# Patient Record
Sex: Male | Born: 1990 | Race: Black or African American | Hispanic: No | Marital: Single | Smoking: Never smoker
Health system: Southern US, Community
[De-identification: ages and names within clinical notes are randomized; demographics above are authoritative.]

## PROBLEM LIST (undated history)

## (undated) DIAGNOSIS — E739 Lactose intolerance, unspecified: Secondary | ICD-10-CM

## (undated) DIAGNOSIS — R569 Unspecified convulsions: Secondary | ICD-10-CM

## (undated) HISTORY — PX: TONSILLECTOMY: SUR1361

---

## 2013-08-09 ENCOUNTER — Emergency Department (HOSPITAL_COMMUNITY): Payer: BC Managed Care – PPO

## 2013-08-09 ENCOUNTER — Emergency Department (HOSPITAL_COMMUNITY)
Admission: EM | Admit: 2013-08-09 | Discharge: 2013-08-09 | Disposition: A | Discharge status: Home / Self Care | Payer: BC Managed Care – PPO | Attending: Emergency Medicine | Admitting: Emergency Medicine

## 2013-08-09 ENCOUNTER — Encounter (HOSPITAL_COMMUNITY): Payer: Self-pay | Admitting: Emergency Medicine

## 2013-08-09 DIAGNOSIS — F1210 Cannabis abuse, uncomplicated: Secondary | ICD-10-CM

## 2013-08-09 DIAGNOSIS — Z862 Personal history of diseases of the blood and blood-forming organs and certain disorders involving the immune mechanism: Secondary | ICD-10-CM | POA: Insufficient documentation

## 2013-08-09 DIAGNOSIS — R569 Unspecified convulsions: Secondary | ICD-10-CM

## 2013-08-09 DIAGNOSIS — Z8639 Personal history of other endocrine, nutritional and metabolic disease: Secondary | ICD-10-CM | POA: Insufficient documentation

## 2013-08-09 DIAGNOSIS — F29 Unspecified psychosis not due to a substance or known physiological condition: Secondary | ICD-10-CM | POA: Insufficient documentation

## 2013-08-09 DIAGNOSIS — N289 Disorder of kidney and ureter, unspecified: Secondary | ICD-10-CM

## 2013-08-09 HISTORY — DX: Lactose intolerance, unspecified: E73.9

## 2013-08-09 LAB — BASIC METABOLIC PANEL
BUN: 12 mg/dL (ref 6–23)
CO2: 22 mEq/L (ref 19–32)
Calcium: 9.4 mg/dL (ref 8.4–10.5)
Chloride: 104 mEq/L (ref 96–112)
GFR calc non Af Amer: 70 mL/min — ABNORMAL LOW (ref >90)
Glucose, Bld: 119 mg/dL — ABNORMAL HIGH (ref 70–99)
Potassium: 3.6 mEq/L (ref 3.5–5.1)
Sodium: 140 mEq/L (ref 135–145)

## 2013-08-09 LAB — CBC
HCT: 38.5 % — ABNORMAL LOW (ref 39.0–52.0)
Hemoglobin: 13.3 g/dL (ref 13.0–17.0)
MCH: 30.7 pg (ref 26.0–34.0)
MCHC: 34.5 g/dL (ref 30.0–36.0)
MCV: 88.9 fL (ref 78.0–100.0)
Platelets: 214 K/uL (ref 150–400)
RBC: 4.33 MIL/uL (ref 4.22–5.81)
RDW: 12.9 % (ref 11.5–15.5)
WBC: 8.1 K/uL (ref 4.0–10.5)

## 2013-08-09 LAB — BASIC METABOLIC PANEL WITH GFR
Creatinine, Ser: 1.42 mg/dL — ABNORMAL HIGH (ref 0.50–1.35)
GFR calc Af Amer: 81 mL/min — ABNORMAL LOW (ref >90)

## 2013-08-09 LAB — GLUCOSE, CAPILLARY: Glucose-Capillary: 114 mg/dL — ABNORMAL HIGH (ref 70–99)

## 2013-08-09 NOTE — ED Notes (Signed)
CBG 114 

## 2013-08-09 NOTE — ED Notes (Signed)
Dr Oletta Lamas speaking with family

## 2013-08-09 NOTE — ED Provider Notes (Signed)
CSN: [id number]     Arrival date & time 08/09/13  0903 History   First MD Initiated Contact with Patient 08/09/13 1024     Chief Complaint  Patient presents with  . Seizures   (Consider location/radiation/quality/duration/timing/severity/associated sxs/prior Treatment) Patient is a 22 y.o. male presenting with seizures. The history is provided by the patient and a relative.  Seizures Seizure activity on arrival: no   Seizure type:  Unable to specify Preceding symptoms: aura   Episode characteristics: abnormal movements, disorientation and tongue biting   Episode characteristics: no incontinence   Postictal symptoms: confusion and memory loss   Return to baseline: yes   Severity:  Mild Timing:  Once Progression:  Resolved Context: decreased sleep, drug use and stress   Context: not alcohol withdrawal, not family hx of seizures, not fever, not intracranial lesion, medical compliance, not possible medication ingestion and not previous head injury   Recent head injury:  No recent head injuries PTA treatment:  None History of seizures: no     Past Medical History  Diagnosis Date  . Lactose intolerance    Past Surgical History  Procedure Laterality Date  . Tonsillectomy     History reviewed. No pertinent family history. History  Substance Use Topics  . Smoking status: Never Smoker   . Smokeless tobacco: Not on file  . Alcohol Use: No    Review of Systems  Constitutional: Negative for fever.  Respiratory: Negative for chest tightness and shortness of breath.   Cardiovascular: Negative for chest pain.  Neurological: Positive for seizures. Negative for headaches.  Psychiatric/Behavioral: Positive for confusion.  All other systems reviewed and are negative.    Allergies  Review of patient's allergies indicates no known allergies.  Home Medications  No current outpatient prescriptions on file. BP 107/60  Pulse 59  Temp(Src) 98.2 F (36.8 C) (Oral)  Resp 18  SpO2  100% Physical Exam  Nursing note and vitals reviewed. Constitutional: He is oriented to person, place, and time. He appears well-developed and well-nourished. No distress.  HENT:  Head: Normocephalic and atraumatic.  Eyes: Conjunctivae and EOM are normal.  Neck: Normal range of motion. Neck supple.  Cardiovascular: Normal rate and regular rhythm.   No murmur heard. Pulmonary/Chest: Effort normal. No respiratory distress.  Neurological: He is alert and oriented to person, place, and time. No cranial nerve deficit. He exhibits normal muscle tone. Coordination normal.  Skin: Skin is dry. He is not diaphoretic.  Psychiatric: He has a normal mood and affect.    ED Course  Procedures (including critical care time) Labs Review Labs Reviewed  BASIC METABOLIC PANEL - Abnormal; Notable for the following:    Glucose, Bld 119 (*)    Creatinine, Ser 1.42 (*)    GFR calc non Af Amer 70 (*)    GFR calc Af Amer 81 (*)    All other components within normal limits  CBC - Abnormal; Notable for the following:    HCT 38.5 (*)    All other components within normal limits  GLUCOSE, CAPILLARY - Abnormal; Notable for the following:    Glucose-Capillary 114 (*)    All other components within normal limits   Imaging Review Ct Head Wo Contrast  08/09/2013   CLINICAL DATA:  Seizure with fall  EXAM: CT HEAD WITHOUT CONTRAST  TECHNIQUE: Contiguous axial images were obtained from the base of the skull through the vertex without intravenous contrast. Study was obtained within 24 hr of patient's arrival at the emergency department.  COMPARISON:  None.  FINDINGS: The ventricles are normal in size and configuration. There is no mass, hemorrhage, extra-axial fluid collection, or midline shift. Gray-white compartments are normal. No demonstrable acute infarct.  The bony calvarium appears intact. The mastoid air cells are clear. There is either a polyp or retention cyst in the right frontal sinus.  IMPRESSION: Polyp  versus retention cyst in right frontal sinus. Study otherwise unremarkable.   Electronically Signed   By: Bretta Bang M.D.   On: 08/09/2013 11:57    EKG Interpretation    Date/Time:  Monday August 09 2013 16:10:96 EST Ventricular Rate:  90 PR Interval:  147 QRS Duration: 106 QT Interval:  372 QTC Calculation: 455 R Axis:   50 Text Interpretation:  Sinus rhythm RSR prime in V2 Otherwise within normal limits No previous tracing           ra sat is 100% and I interpret to be normal   MDM   1. Seizure   2. Renal insufficiency   3. Marijuana abuse       Pt with some features of possible initial seizure.  Pt admits to marijuana use this weekend, more than usual, didn't sleep much today.  Was in class when this occurred.  Has been at baseline, observed in the ED for 4 hours, no recurrences, head CT only shows cyst in sinus.  Labs otherwise ok except minimal renal insuff, this is discussed with pt and family, discussed legal issues regarding driving and need to follow up with PCP and outpt neurology    Gavin Pound. Anushka Hartinger, MD 08/09/13 [phone]

## 2013-08-09 NOTE — ED Notes (Signed)
Seizure pads placed on side rails

## 2013-08-09 NOTE — ED Notes (Addendum)
Pt brought via ems for new onset seizure.  Per ems bystander noticed pt shaking uncontrollably x 1 minute while sitting at desk.  Pt states he felt "wierd" when he woke up and fell twice while walking to class. Pt states he did smoke weed last night.  Worked last night at [employer].  CBG 134 per ems.  Trauma to tongue.  Given  4 mg zofran by ems for emesis x 1.  Denies headache.  Just c/o pain to L knee from fall.

## 2013-08-09 NOTE — Discharge Instructions (Signed)
Marijuana Abuse Your exam shows you have used marijuana or pot. There are many health problems related to marijuana abuse. These include:  Bronchitis.  Chronic cough.  Emphysema.  Lung and upper airway cancer. Abusers also experience impairment in:  Memory.  Judgment.  Ability to learn.  Coordination. Students who smoke marijuana:  Get lower grades.  Are less likely to graduate than those who do not. Adults who abuse marijuana:  Have problems at work.  May even lose their jobs due to:  Poor work International aid/development worker.  Absenteeism. Attention, memory, and learning skills have been shown to be diminished for up to 6 months after stopping regular use, and there is evidence that the effects can be cumulative over a lifetime.  Heavier use of marijuana also puts a strain on relationships with friends and loved ones and can lead to moodiness and loss of confidence. Acute intoxication can lead to:  Increased anxiety.  A panic episode. It also increases the risk for having an automobile accident. This is especially true if the pot is combined with alcohol or other intoxicants. Treatment for acute intoxication is rarely needed. However, medicine to reduce anxiety may be helpful in some people. Millions of people are considered to be dependent on marijuana. It is long-term regular use that leads to addiction and all of its complex problems. Information on the problem of addiction and the health problems of long-term abuse is posted at the [facility] for Drug Abuse website, http://www.price-smith.com/. Consult with your doctor or counselor if you want further information and support in handling this common problem. Document Released: 10/17/2004 Document Revised: 12/02/2011 Document Reviewed: 08/04/2007 [facility] Patient Information 2014 [place], [place].     Seizure, Adult A seizure is abnormal electrical activity in the brain. Seizures can cause a change in attention or behavior (altered  mental status). Seizures often involve uncontrollable shaking (convulsions). Seizures usually last from 30 seconds to 2 minutes. Epilepsy is a brain disorder in which a patient has repeated seizures over time. CAUSES  There are many different problems that can cause seizures. In some cases, no cause is found. Common causes of seizures include:  Head injuries.  Brain tumors.  Infections.  Imbalance of chemicals in the blood.  Kidney failure or liver failure.  Heart disease.  Drug abuse.  Stroke.  Withdrawal from certain drugs or alcohol.  Birth defects.  Malfunction of a neurosurgical device placed in the brain. SYMPTOMS  Symptoms vary depending on the part of the brain that is involved. Right before a seizure, you may have a warning (aura) that a seizure is about to occur. An aura may include the following symptoms:   Fear or anxiety.  Nausea.  Feeling like the room is spinning (vertigo).  Vision changes, such as seeing flashing lights or spots. Common symptoms during a seizure include:  Convulsions.  Drooling.  Rapid eye movements.  Grunting.  Loss of bladder and bowel control.  Bitter taste in the mouth. After a seizure, you may feel confused and sleepy. You may also have an injury resulting from convulsions during the seizure. DIAGNOSIS  Your caregiver will perform a physical exam and run some tests to determine the type and cause of your seizure. These tests may include:  Blood tests.  A lumbar puncture test. In this test, a small amount of fluid is removed from the spine and examined.  Electrocardiography (ECG). This test records the electrical activity in your heart.  Imaging tests, such as computed tomography (CT) scans or magnetic  resonance imaging (MRI).  Electroencephalography (EEG). This test records the electrical activity in your brain. TREATMENT  Seizures usually stop on their own. Treatment will depend on the cause of your seizure. In some  cases, medicine may be given to prevent future seizures. HOME CARE INSTRUCTIONS   If you are given medicines, take them exactly as prescribed by your caregiver.  Keep all follow-up appointments as directed by your caregiver.  Do not swim or drive until your caregiver says it is okay.  Teach friends and family what to do if you have a seizure. They should:  Lay you on the ground to prevent a fall.  Put a cushion under your head.  Loosen any tight clothing around your neck.  Turn you on your side. If vomiting occurs, this helps keep your airway clear.  Stay with you until you recover. SEEK IMMEDIATE MEDICAL CARE IF:  The seizure lasts longer than 2 to 5 minutes.  The seizure is severe or the person does not wake up after the seizure.  The person has altered mental status. Drive the person to the emergency department or call your local emergency services (911 in [place] MAKE SURE YOU:  Understand these instructions.  Will watch your condition.  Will get help right away if you are not doing well or get worse. Document Released: 09/06/2000 Document Revised: 12/02/2011 Document Reviewed: 08/28/2011 [facility] Patient Information 2014 [place], [place].

## 2013-08-17 ENCOUNTER — Encounter: Payer: Self-pay | Admitting: Neurology

## 2013-08-17 ENCOUNTER — Ambulatory Visit (INDEPENDENT_AMBULATORY_CARE_PROVIDER_SITE_OTHER): Payer: BC Managed Care – PPO | Admitting: Neurology

## 2013-08-17 VITALS — BP 116/68 | HR 68 | Ht 71.5 in | Wt 222.0 lb

## 2013-08-17 DIAGNOSIS — G40909 Epilepsy, unspecified, not intractable, without status epilepticus: Secondary | ICD-10-CM | POA: Insufficient documentation

## 2013-08-17 DIAGNOSIS — G40209 Localization-related (focal) (partial) symptomatic epilepsy and epileptic syndromes with complex partial seizures, not intractable, without status epilepticus: Secondary | ICD-10-CM

## 2013-08-17 NOTE — Progress Notes (Signed)
[facility]    Provider:  Dr Hosie Poisson Referring Provider: Lear Ng., MD Primary Care Physician:  No PCP Per Patient  CC:  New onset seizure  HPI:  Jeremy Harrington is a 22 y.o. male here as a referral from Dr. Oletta Lamas for new onset seizure. Was really tired that morning, felt like he was not his normal self, felt full body twitches, fell over a few times. Then had LOC, next thing he recalls was being in an ambulance. Friends in class note that he was talking during the episode, he is unaware of the whole event. Did bite his tongue, no loss bowel/bladder. Woke up at 7am that morning, gone to bed around 2:30am the night before. No different events that night. No breakfast that morning. He denies any EtOH, tobacco, drug usage. No recent fevers, illnesses, neck pain. No history of head trauma. No seizures in the past. No palpitations during the event, no loss of vision/blurry vision prior to the event.   Per ER notes patient admits to marijuana use the night before.   Otherwise healthy.   Head CT reviewed and imaging was unremarkable  Review of Systems: Out of a complete 14 system review, the patient complains of only the following symptoms, and all other reviewed systems are negative. Denies any positive review of systems  History   Social History  . Marital Status: Single    Spouse Name: N/A    Number of Children: 0  . Years of Education: 12+   Occupational History  . [employer]AGE [employer]   Social History Main Topics  . Smoking status: Never Smoker   . Smokeless tobacco: Never Used  . Alcohol Use: Yes     Comment: rare  . Drug Use: No     Comment: patient quit illiect drugs on 08/09/13  . Sexual Activity: Not on file   Other Topics Concern  . Not on file   Social History Narrative   Patient lives with roommates.    Patient works at [employer].    Patient has some college.    Patient has no children.     Family History  Problem Relation Age of Onset   . High blood pressure Mother   . High blood pressure Maternal Uncle     Past Medical History  Diagnosis Date  . Lactose intolerance     Past Surgical History  Procedure Laterality Date  . Tonsillectomy      No current outpatient prescriptions on file.   No current facility-administered medications for this visit.    Allergies as of 08/17/2013  . (No Known Allergies)    Vitals: BP 116/68  Pulse 68  Ht 5' 11.5" (1.816 m)  Wt 222 lb (100.699 kg)  BMI 30.53 kg/m2 Last Weight:  Wt Readings from Last 1 Encounters:  08/17/13 222 lb (100.699 kg)   Last Height:   Ht Readings from Last 1 Encounters:  08/17/13 5' 11.5" (1.816 m)     Physical exam: Exam: Gen: NAD, conversant Eyes: anicteric sclerae, moist conjunctivae HENT: Atraumatic, oropharynx clear Neck: Trachea midline; supple,  Lungs: CTA, no wheezing, rales, rhonic                          CV: RRR, no MRG Abdomen: Soft, non-tender;  Extremities: No peripheral edema  Skin: Normal temperature, no rash,  Psych: Appropriate affect, pleasant  Neuro: MS: AA&Ox3, appropriately interactive, normal affect   Speech: fluent w/o paraphasic error  Memory: good recent and remote recall  CN: PERRL, EOMI no nystagmus, no ptosis, sensation intact to LT V1-V3 bilat, face symmetric, no weakness, hearing grossly intact, palate elevates symmetrically, shoulder shrug 5/5 bilat,  tongue protrudes midline, no fasiculations noted.  Motor: normal bulk and tone Strength: 5/5  In all extremities  Coord: rapid alternating and point-to-point (FNF, HTS) movements intact.  Reflexes: symmetrical, bilat downgoing toes  Sens: LT intact in all extremities  Gait: posture, stance, stride and arm-swing normal. Tandem gait intact. Able to walk on heels and toes. Romberg absent.   Assessment:  After physical and neurologic examination, review of laboratory studies, imaging, neurophysiology testing and pre-existing records, assessment  will be reviewed on the problem list.  Plan:  Treatment plan and additional workup will be reviewed under Problem List.  1)Complex partial seizure  22 y/o gentleman presenting for initial evaluation of likely seizure episode. The event appears to be complex partial in nature as patient was talking during the whole event. Suspect is likely provoked from lack of sleep stress and/or drug use. Patient was counseled to improve sleep hygiene, eat breakfast, void excessive alcohol drug use. Patient was counseled to avoid driving for 6 months. We'll hold off on EEG and or MRI at this time as exam is unremarkable. No signs of infectious process, therefore no LP needed at this time. No indication for antiepileptic medication as this is his initial event. Followup as needed.

## 2014-10-24 ENCOUNTER — Emergency Department (HOSPITAL_COMMUNITY)
Admission: EM | Admit: 2014-10-24 | Discharge: 2014-10-24 | Disposition: A | Discharge status: Home / Self Care | Payer: 59 | Attending: Emergency Medicine | Admitting: Emergency Medicine

## 2014-10-24 ENCOUNTER — Encounter (HOSPITAL_COMMUNITY): Payer: Self-pay | Admitting: Emergency Medicine

## 2014-10-24 DIAGNOSIS — Y998 Other external cause status: Secondary | ICD-10-CM | POA: Insufficient documentation

## 2014-10-24 DIAGNOSIS — R569 Unspecified convulsions: Secondary | ICD-10-CM | POA: Diagnosis present

## 2014-10-24 DIAGNOSIS — S199XXA Unspecified injury of neck, initial encounter: Secondary | ICD-10-CM | POA: Diagnosis not present

## 2014-10-24 DIAGNOSIS — S0081XA Abrasion of other part of head, initial encounter: Secondary | ICD-10-CM | POA: Diagnosis not present

## 2014-10-24 DIAGNOSIS — Y9389 Activity, other specified: Secondary | ICD-10-CM | POA: Insufficient documentation

## 2014-10-24 DIAGNOSIS — Y9289 Other specified places as the place of occurrence of the external cause: Secondary | ICD-10-CM | POA: Insufficient documentation

## 2014-10-24 DIAGNOSIS — G40909 Epilepsy, unspecified, not intractable, without status epilepticus: Secondary | ICD-10-CM | POA: Insufficient documentation

## 2014-10-24 DIAGNOSIS — Z8639 Personal history of other endocrine, nutritional and metabolic disease: Secondary | ICD-10-CM | POA: Insufficient documentation

## 2014-10-24 DIAGNOSIS — X58XXXA Exposure to other specified factors, initial encounter: Secondary | ICD-10-CM | POA: Diagnosis not present

## 2014-10-24 HISTORY — DX: Unspecified convulsions: R56.9

## 2014-10-24 LAB — BASIC METABOLIC PANEL
Anion gap: 14 (ref 5–15)
BUN: 15 mg/dL (ref 6–23)
CHLORIDE: 108 mmol/L (ref 96–112)
CO2: 17 mmol/L — AB (ref 19–32)
CREATININE: 1.69 mg/dL — AB (ref 0.50–1.35)
Calcium: 9.4 mg/dL (ref 8.4–10.5)
GFR calc non Af Amer: 56 mL/min — ABNORMAL LOW (ref >90)
GFR, EST AFRICAN AMERICAN: 65 mL/min — AB (ref >90)
Glucose, Bld: 108 mg/dL — ABNORMAL HIGH (ref 70–99)
POTASSIUM: 4 mmol/L (ref 3.5–5.1)
Sodium: 139 mmol/L (ref 135–145)

## 2014-10-24 LAB — CBC WITH DIFFERENTIAL/PLATELET
BASOS ABS: 0 10*3/uL (ref 0.0–0.1)
Basophils Relative: 0 % (ref 0–1)
Eosinophils Absolute: 0.1 10*3/uL (ref 0.0–0.7)
Eosinophils Relative: 1 % (ref 0–5)
HCT: 41.8 % (ref 39.0–52.0)
Hemoglobin: 14.4 g/dL (ref 13.0–17.0)
LYMPHS PCT: 15 % (ref 12–46)
Lymphs Abs: 1.1 10*3/uL (ref 0.7–4.0)
MCH: 30.6 pg (ref 26.0–34.0)
MCHC: 34.4 g/dL (ref 30.0–36.0)
MCV: 88.7 fL (ref 78.0–100.0)
MONOS PCT: 8 % (ref 3–12)
Monocytes Absolute: 0.6 10*3/uL (ref 0.1–1.0)
NEUTROS ABS: 5.9 10*3/uL (ref 1.7–7.7)
NEUTROS PCT: 76 % (ref 43–77)
Platelets: 207 10*3/uL (ref 150–400)
RBC: 4.71 MIL/uL (ref 4.22–5.81)
RDW: 12.4 % (ref 11.5–15.5)
WBC: 7.6 10*3/uL (ref 4.0–10.5)

## 2014-10-24 NOTE — ED Notes (Signed)
Pt reports to ED via EMS. Girlfriend got pt out of bed this morning and noticed some muscle twitching, as pt got to a standing position he began having full body jerking, hit head on sheetrock and fell to floor. Lac present to posterior right side of head and lac present on left side of tongue. No incontinence reported. Pt complaining of right sided neck pain. Pt had 1 seizure back in Dec 2014. Was told they were related to stress and dehydration. Pt alert and oriented at this time.

## 2014-10-24 NOTE — ED Provider Notes (Signed)
CSN: [id number]     Arrival date & time 10/24/14  1053 History   First MD Initiated Contact with Patient 10/24/14 1110     Chief Complaint  Patient presents with  . Seizures   Jeremy Harrington is a 24 y.o. male with a history of one previous seizure in 2014 who presents to the ED after having a full body seizure just prior to arrival. His girlfriend reports that they noticed twitching prior to his seizure. He then had a full body seizure and fell to the floor. He has a small abrasion to his posterior head and an abrasion to his tongue. The seizure lasted a few seconds. No incontinence of bowel or bladder. Patient reports his last seizure was in December 2014. Patient reports he saw a neurologist at [facility], but has never been on any seizure medication. His mother reports there is a history of epilepsy in his family. At the time of my evaluation the patient reports feeling fine. He denies fevers, chills, recent illness, drug use, headache, changes to his vision, numbness, tingling, chest pain, abdominal pain, nausea, vomiting, shortness of breath or weakness.  (Consider location/radiation/quality/duration/timing/severity/associated sxs/prior Treatment) HPI  Past Medical History  Diagnosis Date  . Lactose intolerance   . Seizures    Past Surgical History  Procedure Laterality Date  . Tonsillectomy     Family History  Problem Relation Age of Onset  . High blood pressure Mother   . High blood pressure Maternal Uncle    History  Substance Use Topics  . Smoking status: Never Smoker   . Smokeless tobacco: Never Used  . Alcohol Use: Yes     Comment: rare    Review of Systems  Constitutional: Negative for fever and chills.  HENT: Negative for congestion, ear pain, sore throat and trouble swallowing.   Eyes: Negative for pain and visual disturbance.  Respiratory: Negative for cough, shortness of breath and wheezing.   Cardiovascular: Negative for chest pain and  palpitations.  Gastrointestinal: Negative for nausea, vomiting, abdominal pain and diarrhea.  Genitourinary: Negative for dysuria and hematuria.  Musculoskeletal: Negative for back pain, neck pain and neck stiffness.  Skin: Negative for rash.  Neurological: Positive for seizures. Negative for dizziness, speech difficulty, weakness, light-headedness, numbness and headaches.      Allergies  Review of patient's allergies indicates no known allergies.  Home Medications   Prior to Admission medications   Medication Sig Start Date End Date Taking? Authorizing Provider  ibuprofen (ADVIL,MOTRIN) 400 MG tablet Take 400 mg by mouth every 6 (six) hours as needed for mild pain.   Yes Historical Provider, MD   BP 118/75 mmHg  Pulse 63  Temp(Src) 97.4 F (36.3 C) (Oral)  Resp 13  SpO2 100% Physical Exam  Constitutional: He is oriented to person, place, and time. He appears well-developed and well-nourished. No distress.  HENT:  Head: Normocephalic.  Right Ear: External ear normal.  Left Ear: External ear normal.  Nose: Nose normal.  Mouth/Throat: Oropharynx is clear and moist. No oropharyngeal exudate.  Very small abrasion, 2 mm, to posterior head. Bleeding is controlled.  No crepitus, deformity or edema noted. Small laceration to his posterior tongue. Laceration is not bleeding. Oropharynx is clear. Bilateral tympanic membranes are pearly-gray without erythema or loss of landmarks.  Eyes: Conjunctivae and EOM are normal. Pupils are equal, round, and reactive to light. Right eye exhibits no discharge. Left eye exhibits no discharge.  Neck: Normal range of motion. Neck supple.  Right  lateral neck is mildly tender to palpation. No midline tenderness. No crepitus, step-off or deformities noted.  Cardiovascular: Normal rate, regular rhythm, normal heart sounds and intact distal pulses.  Exam reveals no gallop and no friction rub.   No murmur heard. Pulmonary/Chest: Effort normal and breath  sounds normal. No respiratory distress. He has no wheezes. He has no rales.  Abdominal: Soft. Bowel sounds are normal. He exhibits no distension. There is no tenderness.  Musculoskeletal: He exhibits no edema.  Lymphadenopathy:    He has no cervical adenopathy.  Neurological: He is alert and oriented to person, place, and time. No cranial nerve deficit. Coordination normal.  Patient alert and oriented 3. Cranial nerves intact.  Skin: Skin is warm and dry. No rash noted. He is not diaphoretic. No erythema. No pallor.  Psychiatric: He has a normal mood and affect. His behavior is normal.  Nursing note and vitals reviewed.   ED Course  Procedures (including critical care time) Labs Review Labs Reviewed  BASIC METABOLIC PANEL - Abnormal; Notable for the following:    CO2 17 (*)    Glucose, Bld 108 (*)    Creatinine, Ser 1.69 (*)    GFR calc non Af Amer 56 (*)    GFR calc Af Amer 65 (*)    All other components within normal limits  CBC WITH DIFFERENTIAL/PLATELET    Imaging Review No results found.   EKG Interpretation   Date/Time:  Monday October 24 2014 11:06:24 EST Ventricular Rate:  69 PR Interval:  163 QRS Duration: 104 QT Interval:  390 QTC Calculation: 418 R Axis:   48 Text Interpretation:  Sinus rhythm Consider left atrial enlargement  Anteroseptal infarct, age indeterminate No significant change since last  tracing Confirmed by [facility]  MD, WHITNEY ([id number]) on 10/24/2014 1:16:59 PM      Filed Vitals:   10/24/14 1245 10/24/14 1300 10/24/14 1315 10/24/14 1330  BP: 114/65 114/62 121/73 118/75  Pulse: 70 59 63 63  Temp:    97.4 F (36.3 C)  TempSrc:    Oral  Resp: 16 18 17 13   SpO2: 98% 98% 100% 100%     MDM   Meds given in ED:  Medications - No data to display  Discharge Medication List as of 10/24/2014  1:26 PM      Final diagnoses:  Seizure   This is a 24 year old male who presents to emergency room after a seizure prior to arrival. Patient reports  this is his second seizure however. His last seizure was 2 years ago in December 2010. Patient is seen neurologist at [facility] but has never been put on seizure medication previously. Patient denies recent illness, fevers, alcohol or drug use. Patient is afebrile and nontoxic-appearing. Patient is alert and oriented 3. Patient has a very small abrasion to his posterior head that is not eating. Patient has a small laceration to the posterior aspect of his tongue that is nonbleeding. Patient denies headache or changes to his vision. Patient denies loss of bowel or bladder control. Patient is neurologically intact. Patient's BMP indicates the elevated creatinine 1.69. He had a previous elevated creatinine his last seizure. I advised the patient of his elevated creatinine and that he needs this to be rechecked by his PCP. I advised the patient [facility] I advised the patient not to drive until he followed up with his neurologist. I advised the patient to follow-up with their primary care provider this week.  I advised the patient to return to the emergency department with new or worsening symptoms or new concerns. The patient verbalized understanding and agreement with plan.   This patient was discussed with Dr. Anitra Lauth who agrees with assessment and plan.      Lawana Chambers, PA-C 10/24/14 1541  Gwyneth Sprout, MD 10/24/14 [phone]

## 2014-10-24 NOTE — Discharge Instructions (Signed)
Seizure, Adult °A seizure is abnormal electrical activity in the brain. Seizures usually last from 30 seconds to 2 minutes. There are various types of seizures. °Before a seizure, you may have a warning sensation (aura) that a seizure is about to occur. An aura may include the following symptoms:  °· Fear or anxiety. °· Nausea. °· Feeling like the room is spinning (vertigo). °· Vision changes, such as seeing flashing lights or spots. °Common symptoms during a seizure include: °· A change in attention or behavior (altered mental status). °· Convulsions with rhythmic jerking movements. °· Drooling. °· Rapid eye movements. °· Grunting. °· Loss of bladder and bowel control. °· Bitter taste in the mouth. °· Tongue biting. °After a seizure, you may feel confused and sleepy. You may also have an injury resulting from convulsions during the seizure. °HOME CARE INSTRUCTIONS  °· If you are given medicines, take them exactly as prescribed by your health care provider. °· Keep all follow-up appointments as directed by your health care provider. °· Do not swim or drive or engage in risky activity during which a seizure could cause further injury to you or others until your health care provider says it is OK. °· Get adequate rest. °· Teach friends and family what to do if you have a seizure. They should: °· Lay you on the ground to prevent a fall. °· Put a cushion under your head. °· Loosen any tight clothing around your neck. °· Turn you on your side. If vomiting occurs, this helps keep your airway clear. °· Stay with you until you recover. °· Know whether or not you need emergency care. °SEEK IMMEDIATE MEDICAL CARE IF: °· The seizure lasts longer than 5 minutes. °· The seizure is severe or you do not wake up immediately after the seizure. °· You have an altered mental status after the seizure. °· You are having more frequent or worsening seizures. °Someone should drive you to the emergency department or call local emergency  services (911 in [place] °MAKE SURE YOU: °· Understand these instructions. °· Will watch your condition. °· Will get help right away if you are not doing well or get worse. °Document Released: 09/06/2000 Document Revised: 06/30/2013 Document Reviewed: 04/21/2013 °ExitCare® Patient Information ©2015 [employer]. This information is not intended to replace advice given to you by your health care provider. Make sure you discuss any questions you have with your health care provider. ° °Driving and Equipment Restrictions °Some medical problems make it dangerous to drive, ride a bike, or use machines. Some of these problems are: °· A hard blow to the head (concussion). °· Passing out (fainting). °· Twitching and shaking (seizures). °· Low blood sugar. °· Taking medicine to help you relax (sedatives). °· Taking pain medicines. °· Wearing an eye patch. °· Wearing splints. This can make it hard to use parts of your body that you need to drive safely. °HOME CARE  °· Do not drive until your doctor says it is okay. °· Do not use machines until your doctor says it is okay. °You may need a form signed by your doctor (medical release) before you can drive again. You may also need this form before you do other tasks where you need to be fully alert. °MAKE SURE YOU: °· Understand these instructions. °· Will watch your condition. °· Will get help right away if you are not doing well or get worse. °Document Released: 10/17/2004 Document Revised: 12/02/2011 Document Reviewed: 01/17/2010 °ExitCare® Patient Information ©2015 [employer].   This information is not intended to replace advice given to you by your health care provider. Make sure you discuss any questions you have with your health care provider. ° °

## 2015-01-25 ENCOUNTER — Telehealth: Payer: Self-pay | Admitting: Neurology

## 2015-01-25 ENCOUNTER — Ambulatory Visit (INDEPENDENT_AMBULATORY_CARE_PROVIDER_SITE_OTHER): Payer: 59 | Admitting: Neurology

## 2015-01-25 ENCOUNTER — Encounter: Payer: Self-pay | Admitting: Neurology

## 2015-01-25 VITALS — BP 117/66 | HR 68 | Ht 70.0 in | Wt 231.0 lb

## 2015-01-25 DIAGNOSIS — G40409 Other generalized epilepsy and epileptic syndromes, not intractable, without status epilepticus: Secondary | ICD-10-CM

## 2015-01-25 DIAGNOSIS — R569 Unspecified convulsions: Secondary | ICD-10-CM

## 2015-01-25 MED ORDER — DIVALPROEX SODIUM ER 500 MG PO TB24: 1000.0000 mg | ORAL_TABLET | Freq: Every day | ORAL | Status: DC

## 2015-01-25 NOTE — Procedures (Signed)
      History: Rosezetta SchlatterScott Newman-Brooks is a 24 year old patient with a history of generalized tonic-clonic seizures and a history of myoclonic jerks. 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  This is a routine EEG. No skull defects are noted. Medications include ibuprofen, and Depakote will be added.  EEG classification: Dysrhythmia grade 3 generalized  Description of the recording: The background rhythms of this recording consists of a well modulated medium amplitude alpha rhythm of 9 Hz that is reactive to eye opening and closure. As the record progresses, photic stimulation is performed, this results in a bilateral and symmetric photic driving response. Hyperventilation is then performed, this results in a minimal buildup of the background rhythm activities, mild symmetric slowing is seen. Throughout the recording, intermittent episodes of generalized sharp and slow-wave complexes are seen, occasionally associated with spike wave discharges that are generalized. There is some suppression of background rhythm activities just prior to and after the generalized discharges. No focal slowing seen. EKG monitor shows no evidence of cardiac rhythm abnormalities with a heart rate of 56.  Impression: This is an abnormal EEG recording secondary to generalized spike-wave and sharp and slow-wave complexes representing interictal activity, no electrographic seizures were recorded. This study suggests a generalized epilepsy syndrome.

## 2015-01-25 NOTE — Patient Instructions (Signed)
Overall you are doing fairly well but I do want to suggest a few things today:   Remember to drink plenty of fluid, eat healthy meals and do not skip any meals. Try to eat protein with a every meal and eat a healthy snack such as fruit or nuts in between meals. Try to keep a regular sleep-wake schedule and try to exercise daily, particularly in the form of walking, 20-30 minutes a day, if you can.   As far as your medications are concerned, I would like to suggest: Depakote extended release 1000mg  in the evening. Discussed common side effects, please see printed documents for details.   As far as diagnostic testing: EEG, MRI of the brain w/wo contrast  I would like to see you back in 6 weeks, sooner if we need to. Please call us with any interim questions, concerns, problems, updates or refill requests.   Please also call us for any test results so we can go over those with you on the phone.  My clinical assistant and will answer any of your questions and relay your messages to me and also relay most of my messages to you.   Our phone number is [phone]. We also have an after hours call service for urgent matters and there is a physician on-call for urgent questions. For any emergencies you know to call 911 or go to the nearest emergency room

## 2015-01-25 NOTE — Telephone Encounter (Signed)
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

## 2015-01-25 NOTE — Progress Notes (Signed)
[facility]    Provider:  Dr Lucia GaskinsAhern Referring Provider: No ref. provider found Primary Care Physician:  No PCP Per Patient  CC: body jerking  HPI:  Jeremy Harrington is a 24 y.o. male here as a follow up for episodes of body jerking and one GTCS seizure. He is a former patient of Dr. Hosie PoissonSumner and is transitioning care to me. Today patient woke up, started getting ready for work, made something to eat. He kept dropping food due to body jerking. Sometimes he will fall, sometimes his arms will go in the air. Happens usually in the mornings. Sometimes he will fall, has fallen three times including today.   He was going down the stairs this morning then fell down the stairs after a jerk. Bit tongue today and in the past He has jerks sporadically, worse in the morning. Can be more whole body or just a limb. He gets tired after he has them and sleeps a lot.  In February he had an episode that became more generalized. Started with jerks, then his whole body was stiff, he fell and his head went through the drywall with blood and foam coming out of his mouth. Girlfriend provides information  today. +Family history of epilepsy however not sure what kind. No staring spells.      Reviewed notes, labs and imaging from outside physicians, which showed:  07/2013: patient seen in ED felt weird then fell twice while walking to class. nex thing he remembers was being in an ambulance.  Trauma to tongue. Was not given AEDs as episode may have been caused by "weed" use and sleep deprivation. 10/24/14: Girlfriend noticed twitching in the morning, then full body jerking, hit head and fell to floor. Lac on left side of tongue.     Ct head showed No acute intracranial abnormalities including mass lesion or mass effect, hydrocephalus, extra-axial fluid collection, midline shift, hemorrhage, or acute infarction, large ischemic events (personally reviewed images)  Review of Systems: Patient complains  of symptoms per HPI as well as the following symptoms: fatigue, blurred vision, excess sweating, dizziness, seizure,weakness,tremors,passing out, back pain, anxious Pertinent negatives per HPI. All others negative.   History   Social History  . Marital Status: Single    Spouse Name: N/A  . Number of Children: 0  . Years of Education: 12+   Occupational History  . [employer]   Social History Main Topics  . Smoking status: Never Smoker   . Smokeless tobacco: Never Used  . Alcohol Use: 0.0 oz/week    0 Standard drinks or equivalent per week     Comment: rare  . Drug Use: No     Comment: patient quit illiect drugs on 08/09/13  . Sexual Activity: Not on file   Other Topics Concern  . Not on file   Social History Narrative   Patient lives with roommates.    Patient works at [employer].    Patient has some college.    Patient has no children.     Family History  Problem Relation Age of Onset  . High blood pressure Mother   . High blood pressure Maternal Uncle     Past Medical History  Diagnosis Date  . Lactose intolerance   . Seizures     Past Surgical History  Procedure Laterality Date  . Tonsillectomy      Current Outpatient Prescriptions  Medication Sig Dispense Refill  . ibuprofen (ADVIL,MOTRIN) 400 MG tablet Take 400 mg by mouth  every 6 (six) hours as needed for mild pain.    . divalproex (DEPAKOTE ER) 500 MG 24 hr tablet Take 2 tablets (1,000 mg total) by mouth daily. 60 tablet 6   No current facility-administered medications for this visit.    Allergies as of 01/25/2015  . (No Known Allergies)    Vitals: BP 117/66 mmHg  Pulse 68  Ht 5\' 10"  (1.778 m)  Wt 231 lb (104.781 kg)  BMI 33.15 kg/m2 Last Weight:  Wt Readings from Last 1 Encounters:  01/25/15 231 lb (104.781 kg)   Last Height:   Ht Readings from Last 1 Encounters:  01/25/15 5\' 10"  (1.778 m)   Physical exam: Exam: Gen: NAD, conversant, well nourised, obese, well groomed                      CV: RRR, no MRG. No Carotid Bruits. No peripheral edema, warm, nontender Eyes: Conjunctivae clear without exudates or hemorrhage  Neuro: Detailed Neurologic Exam  Speech:    Speech is normal; fluent and spontaneous with normal comprehension.  Cognition:    The patient is oriented to person, place, and time;     recent and remote memory intact;     language fluent;     normal attention, concentration,     fund of knowledge Cranial Nerves:    The pupils are equal, round, and reactive to light. The fundi are normal and spontaneous venous pulsations are present. Visual fields are full to finger confrontation. Extraocular movements are intact. Trigeminal sensation is intact and the muscles of mastication are normal. The face is symmetric. The palate elevates in the midline. Hearing intact. Voice is normal. Shoulder shrug is normal. The tongue has normal motion without fasciculations.    Motor Observation:    No asymmetry, no atrophy, and no involuntary movements noted. Tone:    Normal muscle tone.    Posture:    Posture is normal. normal erect    Strength:    Strength is V/V in the upper and lower limbs.         Assessment/Plan:  24 year old male with episodes of myoclonus and GTCS. 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  EEG Start Depakote. Discussed common side effects including tremor, weight gain, hair loss. Will check LFTs. Stop "weed". Advised will test him in 6 weeks. CBC, CMP MRI of the brain w/wo contrast No driving for 6 months seizure free, discussed at length.   Naomie DeanAntonia Nallely Yost, MD  [facility] [address] [place], [place] [id number]  Phone [phone] Fax [phone]  A total of 45 minutes was spent face-to-face with this patient. Over half this time was spent on counseling patient on the seizure diagnosis and different diagnostic and therapeutic options available.

## 2015-01-26 LAB — CBC
HEMATOCRIT: 44.1 % (ref 37.5–51.0)
Hemoglobin: 15.1 g/dL (ref 12.6–17.7)
MCH: 31 pg (ref 26.6–33.0)
MCHC: 34.2 g/dL (ref 31.5–35.7)
MCV: 91 fL (ref 79–97)
Platelets: 256 10*3/uL (ref 150–379)
RBC: 4.87 x10E6/uL (ref 4.14–5.80)
RDW: 13.2 % (ref 12.3–15.4)
WBC: 10.7 10*3/uL (ref 3.4–10.8)

## 2015-01-26 LAB — COMPREHENSIVE METABOLIC PANEL
ALT: 22 IU/L (ref 0–44)
AST: 29 IU/L (ref 0–40)
Albumin/Globulin Ratio: 1.6 (ref 1.1–2.5)
Albumin: 4.5 g/dL (ref 3.5–5.5)
Alkaline Phosphatase: 67 IU/L (ref 39–117)
BUN/Creatinine Ratio: 11 (ref 8–19)
BUN: 18 mg/dL (ref 6–20)
Bilirubin Total: 0.5 mg/dL (ref 0.0–1.2)
CHLORIDE: 102 mmol/L (ref 97–108)
CO2: 21 mmol/L (ref 18–29)
Calcium: 9.8 mg/dL (ref 8.7–10.2)
Creatinine, Ser: 1.6 mg/dL — ABNORMAL HIGH (ref 0.76–1.27)
GFR calc Af Amer: 70 mL/min/{1.73_m2} (ref >59)
GFR calc non Af Amer: 60 mL/min/{1.73_m2} (ref >59)
Globulin, Total: 2.8 g/dL (ref 1.5–4.5)
Glucose: 91 mg/dL (ref 65–99)
POTASSIUM: 4.3 mmol/L (ref 3.5–5.2)
Sodium: 140 mmol/L (ref 134–144)
TOTAL PROTEIN: 7.3 g/dL (ref 6.0–8.5)

## 2015-01-27 ENCOUNTER — Telehealth: Payer: Self-pay

## 2015-01-27 NOTE — Telephone Encounter (Signed)
Pt was informed of elevated Creatine and that he should follow up with his primary care physician.

## 2015-01-30 ENCOUNTER — Other Ambulatory Visit: Payer: Self-pay | Admitting: Neurology

## 2015-01-30 ENCOUNTER — Encounter: Payer: Self-pay | Admitting: Neurology

## 2015-01-30 ENCOUNTER — Telehealth: Payer: Self-pay | Admitting: Neurology

## 2015-01-30 MED ORDER — LEVETIRACETAM 500 MG PO TABS: 500.0000 mg | ORAL_TABLET | Freq: Two times a day (BID) | ORAL | Status: DC

## 2015-01-30 NOTE — Telephone Encounter (Signed)
Placed work letter from Dr. Lucia GaskinsAhern on 01/30/15 at the front desk ([facility]) for pt to pick up.

## 2015-01-30 NOTE — Telephone Encounter (Signed)
I spoke with patient who verified his Rx has been changed by Dr Lucia GaskinsAhern.  Nothing further is needed at this time.

## 2015-01-30 NOTE — Telephone Encounter (Signed)
Patient called and stated that he has some questions about his medication and would like to speak with someone. 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 Please call and advise.

## 2015-02-02 ENCOUNTER — Ambulatory Visit (INDEPENDENT_AMBULATORY_CARE_PROVIDER_SITE_OTHER): Payer: 59

## 2015-02-02 DIAGNOSIS — G40409 Other generalized epilepsy and epileptic syndromes, not intractable, without status epilepticus: Secondary | ICD-10-CM | POA: Diagnosis not present

## 2015-02-03 MED ORDER — GADOPENTETATE DIMEGLUMINE 469.01 MG/ML IV SOLN
20.0000 mL | Freq: Once | INTRAVENOUS | Status: AC | PRN
Start: 2015-02-03 — End: 2015-02-03

## 2015-02-06 ENCOUNTER — Telehealth: Payer: Self-pay

## 2015-02-06 NOTE — Telephone Encounter (Signed)
Spoke with pt about Normal MRI results.  Thanks

## 2015-02-06 NOTE — Telephone Encounter (Signed)
Patient called/returning Joy's call regarding his results. Please call and advise. Patient can be reached @ [phone]

## 2015-02-06 NOTE — Telephone Encounter (Signed)
VM left to inform  Pt of normal MRI results.  He was instructed to call office back

## 2015-03-17 ENCOUNTER — Encounter: Payer: Self-pay | Admitting: Neurology

## 2015-03-17 ENCOUNTER — Ambulatory Visit (INDEPENDENT_AMBULATORY_CARE_PROVIDER_SITE_OTHER): Payer: 59 | Admitting: Neurology

## 2015-03-17 VITALS — BP 134/82 | HR 82 | Ht 70.0 in | Wt 233.0 lb

## 2015-03-17 DIAGNOSIS — N189 Chronic kidney disease, unspecified: Secondary | ICD-10-CM

## 2015-03-17 DIAGNOSIS — G40B09 Juvenile myoclonic epilepsy, not intractable, without status epilepticus: Secondary | ICD-10-CM | POA: Diagnosis not present

## 2015-03-17 MED ORDER — LEVETIRACETAM 500 MG PO TABS: 500.0000 mg | ORAL_TABLET | Freq: Two times a day (BID) | ORAL | Status: AC

## 2015-03-17 NOTE — Patient Instructions (Signed)
Overall you are doing fairly well but I do want to suggest a few things today:   Remember to drink plenty of fluid, eat healthy meals and do not skip any meals. Try to eat protein with a every meal and eat a healthy snack such as fruit or nuts in between meals. Try to keep a regular sleep-wake schedule and try to exercise daily, particularly in the form of walking, 20-30 minutes a day, if you can.   As far as your medications are concerned, I would like to suggest: continue current medications  I would like to see you back in 3 months, sooner if we need to. Please call us with any interim questions, concerns, problems, updates or refill requests.   Please also call us for any test results so we can go over those with you on the phone.  My clinical assistant and will answer any of your questions and relay your messages to me and also relay most of my messages to you.   Our phone number is [phone]. We also have an after hours call service for urgent matters and there is a physician on-call for urgent questions. For any emergencies you know to call 911 or go to the nearest emergency room   

## 2015-03-18 DIAGNOSIS — G40B09 Juvenile myoclonic epilepsy, not intractable, without status epilepticus: Secondary | ICD-10-CM | POA: Insufficient documentation

## 2015-03-18 DIAGNOSIS — N189 Chronic kidney disease, unspecified: Secondary | ICD-10-CM | POA: Insufficient documentation

## 2015-03-18 LAB — COMPREHENSIVE METABOLIC PANEL
A/G RATIO: 1.3 (ref 1.1–2.5)
ALBUMIN: 4.2 g/dL (ref 3.5–5.5)
ALT: 14 IU/L (ref 0–44)
AST: 16 IU/L (ref 0–40)
Alkaline Phosphatase: 67 IU/L (ref 39–117)
BUN / CREAT RATIO: 12 (ref 8–19)
BUN: 18 mg/dL (ref 6–20)
Bilirubin Total: 0.4 mg/dL (ref 0.0–1.2)
CHLORIDE: 103 mmol/L (ref 97–108)
CO2: 23 mmol/L (ref 18–29)
Calcium: 9.9 mg/dL (ref 8.7–10.2)
Creatinine, Ser: 1.53 mg/dL — ABNORMAL HIGH (ref 0.76–1.27)
GFR calc Af Amer: 73 mL/min/{1.73_m2} (ref >59)
GFR calc non Af Amer: 63 mL/min/{1.73_m2} (ref >59)
GLUCOSE: 94 mg/dL (ref 65–99)
Globulin, Total: 3.2 g/dL (ref 1.5–4.5)
POTASSIUM: 4.6 mmol/L (ref 3.5–5.2)
Sodium: 141 mmol/L (ref 134–144)
TOTAL PROTEIN: 7.4 g/dL (ref 6.0–8.5)

## 2015-03-18 LAB — CBC
Hematocrit: 43.5 % (ref 37.5–51.0)
Hemoglobin: 14.8 g/dL (ref 12.6–17.7)
MCH: 30.1 pg (ref 26.6–33.0)
MCHC: 34 g/dL (ref 31.5–35.7)
MCV: 89 fL (ref 79–97)
Platelets: 287 10*3/uL (ref 150–379)
RBC: 4.91 x10E6/uL (ref 4.14–5.80)
RDW: 13.4 % (ref 12.3–15.4)
WBC: 6.4 10*3/uL (ref 3.4–10.8)

## 2015-03-18 NOTE — Progress Notes (Signed)
Jeremy Harrington    Provider:  Dr Lucia Gaskins Referring Provider: No ref. provider found Primary Care Physician:  No PCP Per Patient  Provider: Dr Lucia Gaskins Referring Provider: No ref. provider found Primary Care Physician: No PCP Per Patient  CC: body jerking  Interval follow up 03/17/2015:   This is a 24 year old male who is here for follow-up of juvenile myoclonic epilepsy. Patient is doing well on the Keppra. No further events. He did not tolerate the Depakote. He is here with his mother today who has a lot of questions. Patient reports that thinking back on his history, the symptoms did start when he was an adolescent. This is very common for juvenile myoclonic epilepsy, a healthy teenager with new onset symptoms. Discussed at length with mother and patient. Juvenile myoclonic epilepsy has multiple types of seizures including myoclonus, generalized tonic-clonic seizures and absence type episodes. Patient has had mild clonus especially early in the morning and has also had generalized tonic-clonic events. He has never had absence seizures which is the least common type in this disorder. This will likely be lifelong condition however symptoms may wane with age. Discussed Keppra and side effects. Advised patient not to skip any doses, this is the most common cause of recurrent events. If he does have any more seizures, we'll increase Keppra. Provided literature on this condition as well as on Keppra and medications used to treat. Patient should not drive until at least 6 months to a year seizure-free per [facility] law or based on his residency location. Also advised him not to perform tasks where he might get hurt or others might get hurt should he have another episode. These are common sense, such as swimming alone.   EEG: This is an abnormal EEG recording secondary to generalized spike-wave and sharp and slow-wave complexes representing interictal activity, no electrographic  seizures were recorded. This study suggests a generalized epilepsy syndrome.  MRi of the brain: This is a normal MRI of the brain with and without contrast  Initial visit 01/25/2015: Jeremy Harrington is a 24 y.o. male here as a follow up for episodes of body jerking and one GTCS seizure. He is a former patient of Dr. Hosie Poisson and is transitioning care to me. Today patient woke up, started getting ready for work, made something to eat. He kept dropping food due to body jerking. Sometimes he will fall, sometimes his arms will go in the air. Happens usually in the mornings. Sometimes he will fall, has fallen three times including today. He was going down the stairs this morning then fell down the stairs after a jerk. Bit tongue today and in the past He has jerks sporadically, worse in the morning. Can be more whole body or just a limb. He gets tired after he has them and sleeps a lot. In February he had an episode that became more generalized. Started with jerks, then his whole body was stiff, he fell and his head went through the drywall with blood and foam coming out of his mouth.  Girlfriend provides information today. +Family history of epilepsy however not sure what kind. No staring spells.     Reviewed notes, labs and imaging from outside physicians, which showed:  07/2013: patient seen in ED felt weird then fell twice while walking to class. nex thing he remembers was being in an ambulance. Trauma to tongue. Was not given AEDs as episode may have been caused by "weed" use and sleep deprivation. 10/24/14: Girlfriend noticed twitching in the  morning, then full body jerking, hit head and fell to floor. Lac on left side of tongue.     Ct head showed No acute intracranial abnormalities including mass lesion or mass effect, hydrocephalus, extra-axial fluid collection, midline shift, hemorrhage, or acute infarction, large ischemic events (personally reviewed images) Review of Systems: Patient  complains of symptoms per HPI as well as the following symptoms: No CP. No SOB. Pertinent negatives per HPI. All others negative.   History   Social History  . Marital Status: Single    Spouse Name: N/A  . Number of Children: 0  . Years of Education: 12+   Occupational History  . [employer]   Social History Main Topics  . Smoking status: Never Smoker   . Smokeless tobacco: Never Used  . Alcohol Use: 0.0 oz/week    0 Standard drinks or equivalent per week     Comment: rare  . Drug Use: No     Comment: patient quit illiect drugs on 08/09/13  . Sexual Activity: Not on file   Other Topics Concern  . Not on file   Social History Narrative   Patient lives with roommates.    Patient works at [employer].    Patient has some college.    Patient has no children.     Family History  Problem Relation Age of Onset  . High blood pressure Mother   . High blood pressure Maternal Uncle     Past Medical History  Diagnosis Date  . Lactose intolerance   . Seizures     Past Surgical History  Procedure Laterality Date  . Tonsillectomy      Current Outpatient Prescriptions  Medication Sig Dispense Refill  . ibuprofen (ADVIL,MOTRIN) 400 MG tablet Take 400 mg by mouth every 6 (six) hours as needed for mild pain.    . meloxicam (MOBIC) 15 MG tablet Take 15 mg by mouth daily.    Marland Kitchen levETIRAcetam (KEPPRA) 500 MG tablet Take 1 tablet (500 mg total) by mouth 2 (two) times daily. 180 tablet 3   No current facility-administered medications for this visit.    Allergies as of 03/17/2015  . (No Known Allergies)    Vitals: BP 134/82 mmHg  Pulse 82  Ht 5\' 10"  (1.778 m)  Wt 233 lb (105.688 kg)  BMI 33.43 kg/m2 Last Weight:  Wt Readings from Last 1 Encounters:  03/17/15 233 lb (105.688 kg)   Last Height:   Ht Readings from Last 1 Encounters:  03/17/15 5\' 10"  (1.778 m)    Physical exam: Exam: Gen: NAD, conversant, well nourised, well groomed                     CV: RRR, no  MRG. No Carotid Bruits. No peripheral edema, warm, nontender Eyes: Conjunctivae clear without exudates or hemorrhage  Neuro: Detailed Neurologic Exam  Speech:    Speech is normal; fluent and spontaneous with normal comprehension.  Cognition:    The patient is oriented to person, place, and time;     recent and remote memory intact;     language fluent;     normal attention, concentration,     fund of knowledge Cranial Nerves:    The pupils are equal, round, and reactive to light. The fundi are normal and spontaneous venous pulsations are present. Visual fields are full to finger confrontation. Extraocular movements are intact. Trigeminal sensation is intact and the muscles of mastication are normal. The face is symmetric. The palate  elevates in the midline. Hearing intact. Voice is normal. Shoulder shrug is normal. The tongue has normal motion without fasciculations.   Coordination:    Normal finger to nose and heel to shin. Normal rapid alternating movements.   Gait:    Heel-toe and tandem gait are normal.   Motor Observation:    No asymmetry, no atrophy, and no involuntary movements noted. Tone:    Normal muscle tone.    Posture:    Posture is normal. normal erect    Strength:    Strength is V/V in the upper and lower limbs.          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   Continue Keppra. Do not miss a dose. No driving or 6 months to a year free per [facility]. Do not participate in activities that could harm yourself or others should you have another episode such as swimming alone or anything else. EEG confirmed generalized epilepsy and MRI of the brain was normal. CMP and cbc today  Naomie Dean, MD  [facility] [address] [place], [place] [address]  Phone [phone] Fax [phone]  A total of 45  minutes was spent face-to-face with this patient and his mother. Over half this time was spent on counseling patient on the myoclonic epilepsy diagnosis and different diagnostic and therapeutic options available.

## 2015-03-20 ENCOUNTER — Telehealth: Payer: Self-pay | Admitting: *Deleted

## 2015-03-20 NOTE — Telephone Encounter (Signed)
Left VM for pt to call back about results. Gave [facility] phone number and office hours. Told him Dr. Lucia GaskinsAhern and I are not in the office on Friday's.

## 2015-03-23 ENCOUNTER — Encounter: Payer: Self-pay | Admitting: *Deleted

## 2015-03-23 NOTE — Telephone Encounter (Signed)
Left another detailed VM to let pt know I was calling about results. Gave [facility] phone number and office hours. Told him Dr. Lucia GaskinsAhern and I are not here in Friday's and we will be closed Monday for the holiday.

## 2015-03-23 NOTE — Telephone Encounter (Signed)
Spoke with mother, Rozell SearingSabina about lab results. Told her  "his creatinine was abnormal again. He may have chronic kidney disease. He needs to follow up with his primary care if he has not done so already. This can be very serious, he needs to follow this" per Dr. Lucia GaskinsAhern. She verbalized understanding and said she would let him know. I told her I will also may the letter and copy of results to her. I verified mailing address.

## 2015-04-03 ENCOUNTER — Encounter: Payer: Self-pay | Admitting: Neurology

## 2015-08-12 ENCOUNTER — Encounter: Payer: Self-pay | Admitting: Emergency Medicine

## 2015-08-12 ENCOUNTER — Emergency Department
Admission: EM | Admit: 2015-08-12 | Discharge: 2015-08-12 | Disposition: A | Discharge status: Home / Self Care | Payer: PRIVATE HEALTH INSURANCE | Attending: Emergency Medicine | Admitting: Emergency Medicine

## 2015-08-12 DIAGNOSIS — Y998 Other external cause status: Secondary | ICD-10-CM | POA: Insufficient documentation

## 2015-08-12 DIAGNOSIS — X58XXXA Exposure to other specified factors, initial encounter: Secondary | ICD-10-CM | POA: Insufficient documentation

## 2015-08-12 DIAGNOSIS — S01112A Laceration without foreign body of left eyelid and periocular area, initial encounter: Secondary | ICD-10-CM | POA: Insufficient documentation

## 2015-08-12 DIAGNOSIS — Z791 Long term (current) use of non-steroidal anti-inflammatories (NSAID): Secondary | ICD-10-CM | POA: Insufficient documentation

## 2015-08-12 DIAGNOSIS — R569 Unspecified convulsions: Secondary | ICD-10-CM | POA: Insufficient documentation

## 2015-08-12 DIAGNOSIS — Y9389 Activity, other specified: Secondary | ICD-10-CM | POA: Insufficient documentation

## 2015-08-12 DIAGNOSIS — IMO0002 Reserved for concepts with insufficient information to code with codable children: Secondary | ICD-10-CM

## 2015-08-12 DIAGNOSIS — Y9289 Other specified places as the place of occurrence of the external cause: Secondary | ICD-10-CM | POA: Insufficient documentation

## 2015-08-12 MED ORDER — LEVETIRACETAM 500 MG PO TABS
500.0000 mg | ORAL_TABLET | Freq: Once | ORAL | Status: AC
  Administered 2015-08-12: 500 mg via ORAL

## 2015-08-12 MED ORDER — LEVETIRACETAM 500 MG PO TABS
1000.0000 mg | ORAL_TABLET | Freq: Once | ORAL | Status: DC
  Filled 2015-08-12: qty 1

## 2015-08-12 NOTE — ED Notes (Signed)
Pt advised to follow up with neurologist. Also advised he should not be driving for 6 months and to follow up within a week.

## 2015-08-12 NOTE — Discharge Instructions (Signed)

## 2015-08-12 NOTE — ED Notes (Signed)
PT arrived via EMS from work at [employer] where it is suspected he had a seizure that was unwitnessed. EMS reports that first responders said he was a little disoriented and diaphoretic and was found face planted on the concrete.  Pt reports he has been diagnosed within the past year with a seizure disorder and follows up at [facility]. Pt reports he had a seizure several months ago.  PT reports sometimes before he has a seizure he gets a twitching and reports he tripped a few times and kept dropping his phone. Pt is on Keppra, but has not been taking it. He does have a bite mark on his tongue and laceration to left eye brow. He also states he takes Tramadol and took a dose this morning.

## 2015-08-12 NOTE — ED Provider Notes (Signed)
[facility] Emergency Department Provider Note  ____________________________________________  Time seen: Seen upon arrival to the emergency department  I have reviewed the triage vital signs and the nursing notes.   HISTORY  Chief Complaint Seizures    HPI Jeremy Harrington is a 24 y.o. male with a history of a seizure disorder who is presenting today after seizure work. The patient says that he only got 2 hours of sleep last night and felt "twitchy" this morning which is usually a prodrome to his seizures. He says that he has not been on his Keppra but took 500 mg of Keppra this morning that he thought he may have a seizure. He reports tripping and also dropping his phone which he says is what is typical for a seizures. The details of the seizure were not available. EMS was not able to give the duration or the character of his seizures. The patient does see a neurologist in [place]. He says that he has been noncompliant with his Keppra and before this morning does not remember the last time he took it. He does have enough of it at home that he does not need a refill prescription. He says his last seizure was several months ago. He denies losing any bowel or bladder continence. Per EMS she was confused after the episode but is now regained full alertness and orientation. He denies any weakness or numbness at this time. He sustained a laceration to his left eyebrow during the episode.Patient says he has had a tetanus shot within the past 5 years.   Past Medical History  Diagnosis Date  . Lactose intolerance   . Seizures [facility])     Patient Active Problem List   Diagnosis Date Noted  . 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 CKD (chronic kidney disease) 03/18/2015  . 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 Seizure disorder (HCC) 08/17/2013    Past Surgical History  Procedure Laterality Date  . Tonsillectomy      Current Outpatient Rx  Name  Route   Sig  Dispense  Refill  . ibuprofen (ADVIL,MOTRIN) 400 MG tablet   Oral   Take 400 mg by mouth every 6 (six) hours as needed for mild pain.         Marland Kitchen levETIRAcetam (KEPPRA) 500 MG tablet   Oral   Take 1 tablet (500 mg total) by mouth 2 (two) times daily.   180 tablet   3   . meloxicam (MOBIC) 15 MG tablet   Oral   Take 15 mg by mouth daily.           Allergies Lactose intolerance (gi)  Family History  Problem Relation Age of Onset  . High blood pressure Mother   . High blood pressure Maternal Uncle     Social History Social History  Substance Use Topics  . Smoking status: Never Smoker   . Smokeless tobacco: Never Used  . Alcohol Use: 0.0 oz/week    0 Standard drinks or equivalent per week     Comment: rare    Review of Systems Constitutional: No fever/chills Eyes: No visual changes. ENT: No sore throat. Cardiovascular: Denies chest pain. Respiratory: Denies shortness of breath. Gastrointestinal: No abdominal pain.  No nausea, no vomiting.  No diarrhea.  No constipation. Genitourinary: Negative for dysuria. Musculoskeletal: Negative for back pain. Skin: Negative for rash. Neurological: Negative for headaches, focal weakness or numbness.  10-point ROS otherwise negative.  ____________________________________________   PHYSICAL EXAM:  VITAL SIGNS: ED  Triage Vitals  Enc Vitals Group     BP 08/12/15 0933 122/78 mmHg     Pulse Rate 08/12/15 0933 88     Resp 08/12/15 0933 18     Temp 08/12/15 0933 98 F (36.7 C)     Temp Source 08/12/15 0933 Oral     SpO2 08/12/15 0933 98 %     Weight 08/12/15 0933 253 lb 12 oz (115.1 kg)     Height 08/12/15 0933 6' (1.829 m)     Head Cir --      Peak Flow --      Pain Score 08/12/15 0936 0     Pain Loc --      Pain Edu? --      Excl. in GC? --     Constitutional: Alert and oriented. Well appearing and in no acute distress. Eyes: Conjunctivae are normal. PERRL. EOMI. Head: Laceration to the lateral left  eyebrow which is 3 cm. No active bleeding, surrounding induration or pus. Horizontal in orientation. It is through to the subcutaneous tissue. Nose: No congestion/rhinnorhea. Mouth/Throat: Mucous membranes are moist.  Abrasion to the anterior and top of the tongue that appears as a bite. Several teeth indentations can be seen. There is no active bleeding. Neck: No stridor.   Cardiovascular: Normal rate, regular rhythm. Grossly normal heart sounds.  Good peripheral circulation. Respiratory: Normal respiratory effort.  No retractions. Lungs CTAB. Gastrointestinal: Soft and nontender. No distention. No abdominal bruits. No CVA tenderness. Musculoskeletal: No lower extremity tenderness nor edema.  No joint effusions. Neurologic:  Normal speech and language. No gross focal neurologic deficits are appreciated. No gait instability. Skin:  Skin is warm, dry and intact. No rash noted. Psychiatric: Mood and affect are normal. Speech and behavior are normal.  ____________________________________________   LABS (all labs ordered are listed, but only abnormal results are displayed)  Labs Reviewed - No data to display ____________________________________________  EKG   ____________________________________________  RADIOLOGY   ____________________________________________   PROCEDURES  LACERATION REPAIR Performed by: Arelia LongestSchaevitz,  Charnele Semple M Authorized by: Arelia LongestSchaevitz,  Etsuko Dierolf M Consent: Verbal consent obtained. Risks and benefits: risks, benefits and alternatives were discussed Consent given by: patient Patient identity confirmed: provided demographic data Prepped and Draped in normal sterile fashion Wound explored  Laceration Location: Just lateral to the left eyebrow. Laceration is not within the hair of the eyebrow.  Laceration Length: 3 cm  No Foreign Bodies seen or palpated    Irrigation method: syringe Amount of cleaning: standard  Skin closure: Dermabond with overlying  Steri-Strips.     Patient tolerance: Patient tolerated the procedure well with no immediate complications. Good approximation. Patient tolerated well.   ____________________________________________   INITIAL IMPRESSION / ASSESSMENT AND PLAN / ED COURSE  Pertinent labs & imaging results that were available during my care of the patient were reviewed by me and considered in my medical decision making (see chart for details).  Patient with history of seizures off his seizure medication and also with minimal sleep last night. Typical prodrome for him prior to the seizure episode this morning. We gave him an additional 500 mg by mouth Rezulin dose. He says that he does not need a refill of his medications and will resume taking his Keppra at home. He will also call for follow-up with [place] neurologist in [place] for a follow-up appointment to be seen within one week. I discussed with him that he is not allowed to drive until cleared to do so by his  neurologist. The patient is alert and oriented at this time without any focal neuro deficits. I also instructed him to keep the wound dry for the next 24 hours then he may shower normally. Patient understands the plan and is willing to comply. Furthermore, the patient does not a headache and is neurologically intact. I do not think that he will require CAT scan at this time. ____________________________________________   FINAL CLINICAL IMPRESSION(S) / ED DIAGNOSES   Seizure. Laceration.   Myrna Blazer, MD 08/12/15 [phone]

## 2015-08-26 IMAGING — CT CT HEAD W/O CM
1 series · 12 of 14 positions shown, 15 images · non-contrast
Comparison: None.

CLINICAL DATA: Seizure with fall

EXAM:
CT HEAD WITHOUT CONTRAST
TECHNIQUE: Contiguous axial images were obtained from the base of the skull
through the vertex without intravenous contrast. Study was obtained
within 24 hr of patient's arrival at the emergency department.

[Series 2: head 5.0 h30s · axial · 0.44mm/px · z∈[-148,-23]mm · 12 of 31 slices shown, 15 images]
[im 3/31  soft-tissue]
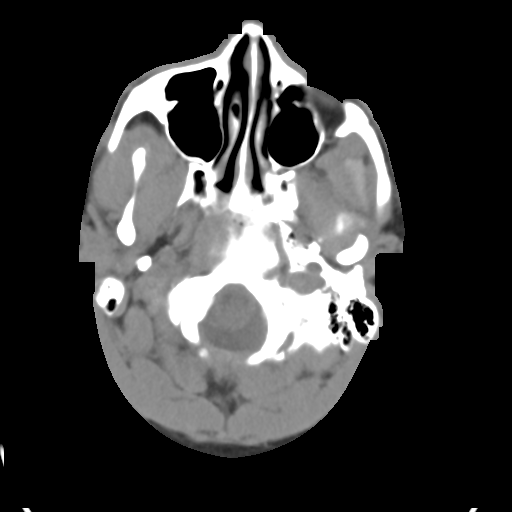
[im 3/31  bone]
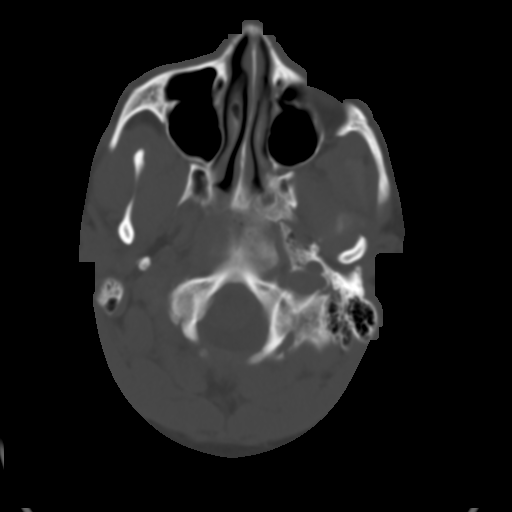
[im 5/31  bone]
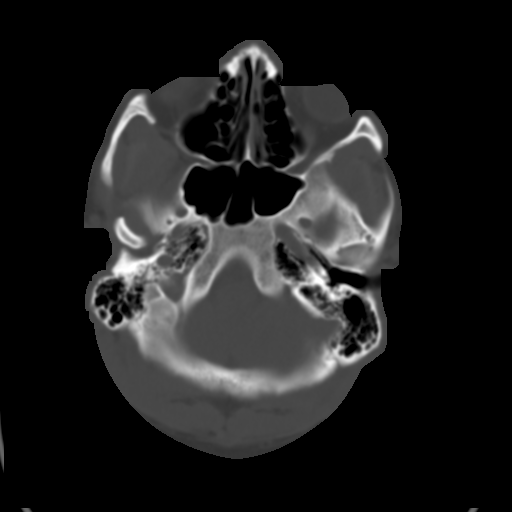
[im 7/31  bone]
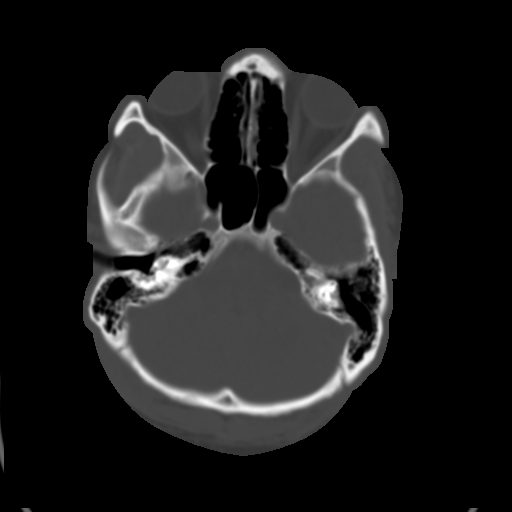
[im 10/31  bone]
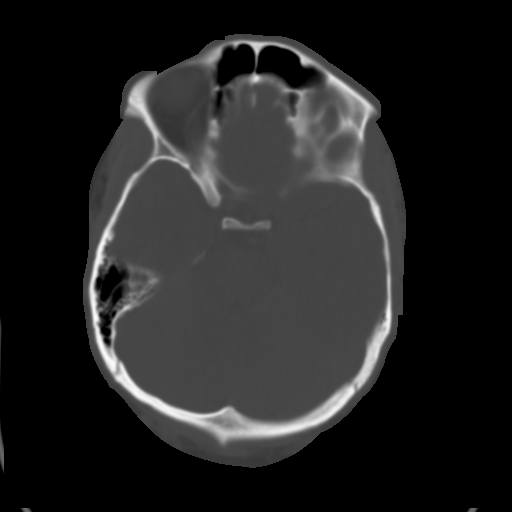
[im 12/31  soft-tissue]
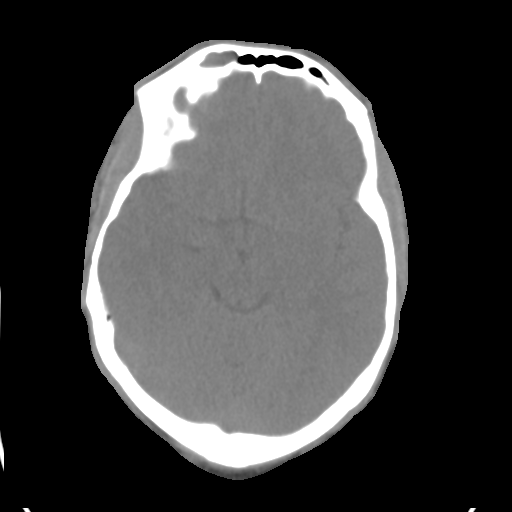
[im 12/31  bone]
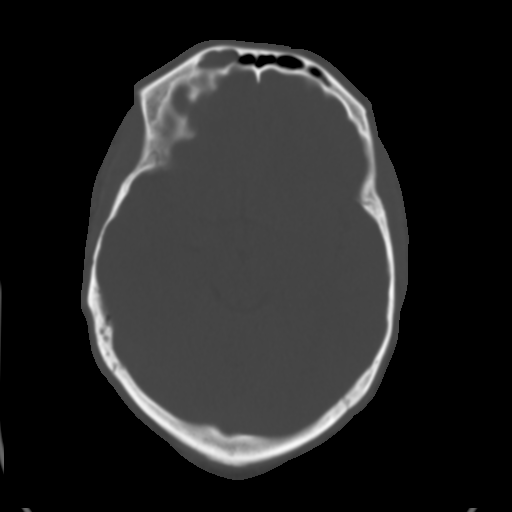
[im 14/31  bone]
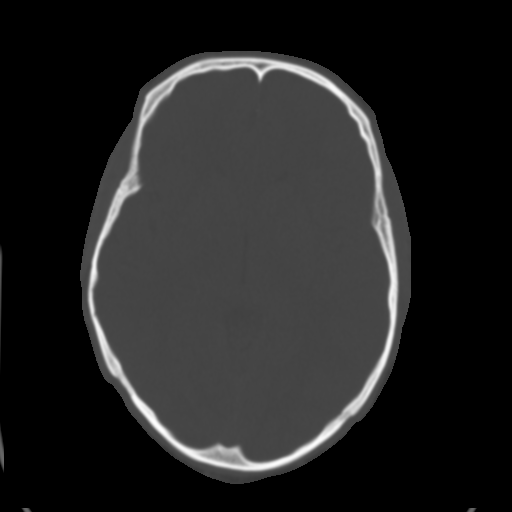
[im 17/31  bone]
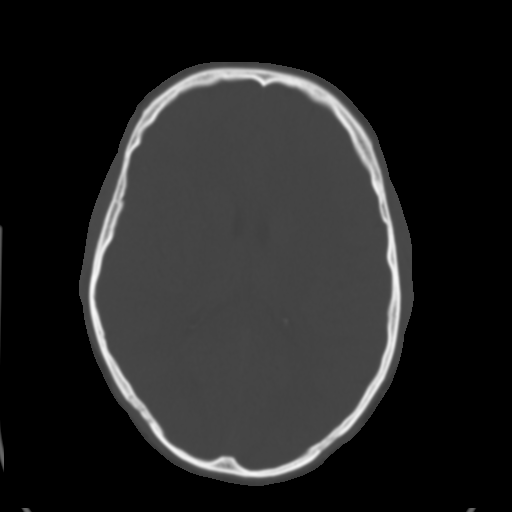
[im 19/31  bone]
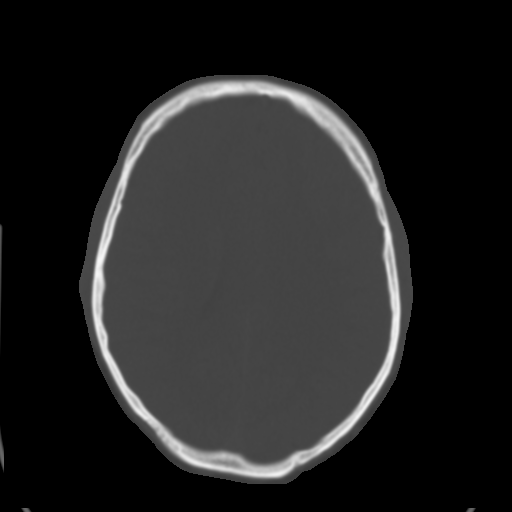
[im 21/31  soft-tissue]
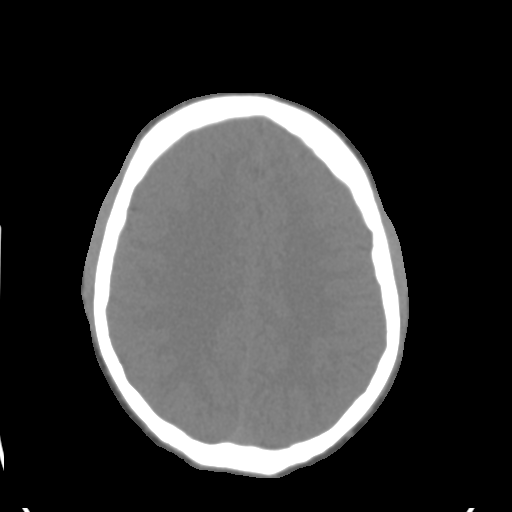
[im 21/31  bone]
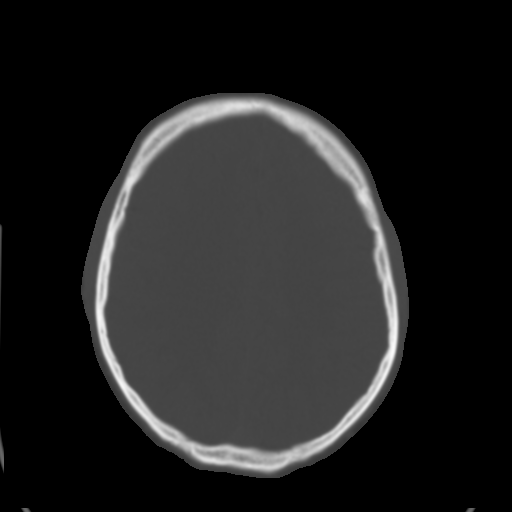
[im 24/31  bone]
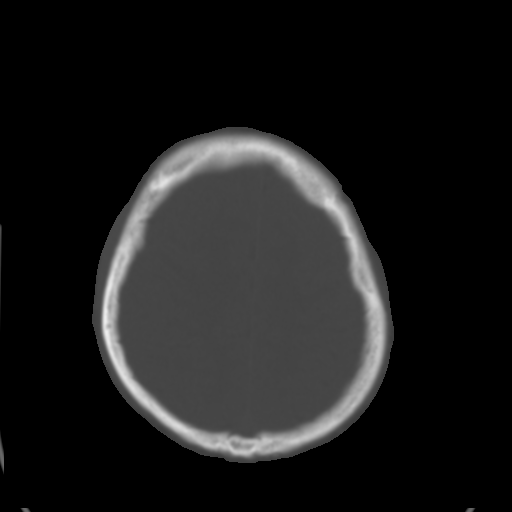
[im 26/31  bone]
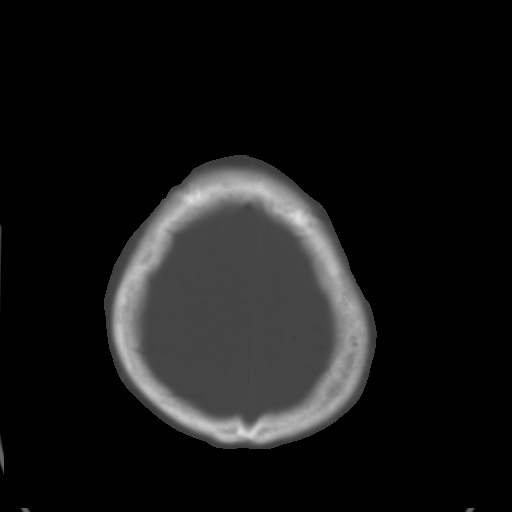
[im 28/31  bone]
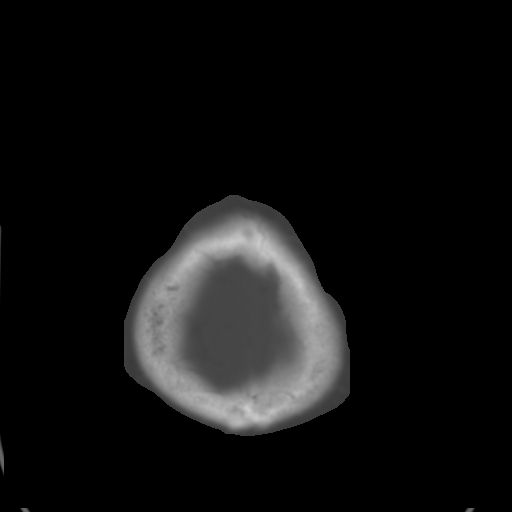

[12 of 14 positions shown; findings below may reference images not displayed]

FINDINGS: The ventricles are normal in size and configuration. There is no
mass, hemorrhage, extra-axial fluid collection, or midline shift.
Gray-white compartments are normal. No demonstrable acute infarct.

The bony calvarium appears intact. The mastoid air cells are clear.
There is either a polyp or retention cyst in the right frontal
sinus.
IMPRESSION: Polyp versus retention cyst in right frontal sinus. Study otherwise
unremarkable.

## 2015-09-11 ENCOUNTER — Encounter: Payer: Self-pay | Admitting: Neurology

## 2015-09-19 ENCOUNTER — Ambulatory Visit: Payer: 59 | Admitting: Neurology
